# Patient Record
Sex: Female | Born: 1975 | Hispanic: Yes | Marital: Single | Smoking: Never smoker
Health system: Southern US, Community
[De-identification: ages and names within clinical notes are randomized; demographics above are authoritative.]

---

## 2017-10-22 ENCOUNTER — Encounter: Payer: Self-pay | Admitting: Pediatric Intensive Care

## 2017-10-30 ENCOUNTER — Ambulatory Visit: Payer: Self-pay | Admitting: Internal Medicine

## 2017-11-01 ENCOUNTER — Emergency Department (HOSPITAL_COMMUNITY): Payer: Self-pay

## 2017-11-01 ENCOUNTER — Emergency Department (HOSPITAL_COMMUNITY)
Admission: EM | Admit: 2017-11-01 | Discharge: 2017-11-01 | Disposition: A | Discharge status: Home / Self Care | Payer: Self-pay | Attending: Emergency Medicine | Admitting: Emergency Medicine

## 2017-11-01 ENCOUNTER — Encounter (HOSPITAL_COMMUNITY): Payer: Self-pay

## 2017-11-01 ENCOUNTER — Other Ambulatory Visit: Payer: Self-pay

## 2017-11-01 DIAGNOSIS — H9202 Otalgia, left ear: Secondary | ICD-10-CM | POA: Insufficient documentation

## 2017-11-01 DIAGNOSIS — R42 Dizziness and giddiness: Secondary | ICD-10-CM | POA: Insufficient documentation

## 2017-11-01 MED ORDER — NEOMYCIN-POLYMYXIN-HC 3.5-10000-1 OT SUSP: 4.0000 [drp] | Freq: Three times a day (TID) | OTIC | 0 refills | Status: AC

## 2017-11-01 NOTE — ED Triage Notes (Signed)
Pt was tx last month with amoxicillin and meclizine for ear pain and the sensation that something was in ear.  She's also been taking cipro with no relief.  C/o loss of hearing, ringing sensation and occasional dizziness (but no dizziness presently).

## 2017-11-01 NOTE — ED Provider Notes (Signed)
[facility] Provider Note   CSN: [id number] Arrival date & time: 11/01/17  1727     History   Chief Complaint Chief Complaint  Patient presents with  . Otalgia  . Dizziness    HPI Marissa Stephens is a 42 y.o. female who presents to ED for evaluation of several month history of left-sided ear pain, ringing in her ears, occasional dizziness and headache.  Patient has been treated with amoxicillin at another emergency room visit.  She has also been taking some Cipro eardrops and Cipro p.o. given by her mother from [place].  States that none of these are helping with her pain.  The only relief she got was from apparent benzocaine eardrops that she got over-the-counter.  She is concerned that there is a "worm" in her ear or another "animal" that is causing her to have the symptoms.  No inciting event noted.  Denies any vision changes, head injuries or falls, fever, neck pain, vomiting, changes in hearing, chronic NSAID use.  HPI  History reviewed. No pertinent past medical history.  There are no active problems to display for this patient.   History reviewed. No pertinent surgical history.   OB History   None      Home Medications    Prior to Admission medications   Medication Sig Start Date End Date Taking? Authorizing Provider  neomycin-polymyxin-hydrocortisone (CORTISPORIN) 3.5-10000-1 OTIC suspension Place 4 drops into the left ear 3 (three) times daily. 11/01/17   Dietrich PatesKhatri, Tesneem Dufrane, PA-C    Family History No family history on file.  Social History Social History   Tobacco Use  . Smoking status: Never Smoker  . Smokeless tobacco: Never Used  Substance Use Topics  . Alcohol use: Not Currently  . Drug use: Never     Allergies   Ibuprofen   Review of Systems Review of Systems  Constitutional: Negative for appetite change, chills and fever.  HENT: Positive for ear pain. Negative for rhinorrhea, sneezing and sore  throat.   Eyes: Negative for photophobia and visual disturbance.  Respiratory: Negative for cough, chest tightness, shortness of breath and wheezing.   Cardiovascular: Negative for chest pain and palpitations.  Gastrointestinal: Negative for abdominal pain, blood in stool, constipation, diarrhea, nausea and vomiting.  Genitourinary: Negative for dysuria, hematuria and urgency.  Musculoskeletal: Negative for myalgias.  Skin: Negative for rash.  Neurological: Positive for headaches. Negative for dizziness, weakness and light-headedness.     Physical Exam Updated Vital Signs BP 102/60 (BP Location: Right Arm)   Pulse 76   Temp 99 F (37.2 C) (Oral)   Resp 14   Ht 5\' 4"  (1.626 m)   Wt 49.9 kg (110 lb)   LMP 10/29/2017   SpO2 100%   BMI 18.88 kg/m   Physical Exam  Constitutional: She is oriented to person, place, and time. She appears well-developed and well-nourished. No distress.  HENT:  Head: Normocephalic and atraumatic.  Right Ear: Tympanic membrane normal.  Left Ear: Tympanic membrane normal.  Nose: Nose normal.  Eyes: Conjunctivae and EOM are normal. Left eye exhibits no discharge. No scleral icterus.  Neck: Normal range of motion. Neck supple.  Cardiovascular: Normal rate, regular rhythm, normal heart sounds and intact distal pulses. Exam reveals no gallop and no friction rub.  No murmur heard. Pulmonary/Chest: Effort normal and breath sounds normal. No respiratory distress.  Abdominal: Soft. Bowel sounds are normal. She exhibits no distension. There is no tenderness. There is no guarding.  Musculoskeletal:  Normal range of motion. She exhibits no edema.  Neurological: She is alert and oriented to person, place, and time. No cranial nerve deficit or sensory deficit. She exhibits normal muscle tone. Coordination normal.  Pupils reactive. No facial asymmetry noted. Cranial nerves appear grossly intact. Sensation intact to light touch on face.  Skin: Skin is warm and dry. No  rash noted.  Psychiatric: She has a normal mood and affect.  Nursing note and vitals reviewed.    ED Treatments / Results  Labs (all labs ordered are listed, but only abnormal results are displayed) Labs Reviewed - No data to display  EKG None  Radiology Ct Head Wo Contrast  Result Date: 11/01/2017 CLINICAL DATA:  42 year old female with dizziness and ringing sensation. EXAM: CT HEAD WITHOUT CONTRAST TECHNIQUE: Contiguous axial images were obtained from the base of the skull through the vertex without intravenous contrast. COMPARISON:  None. FINDINGS: Brain: No evidence of acute infarction, hemorrhage, hydrocephalus, extra-axial collection or mass lesion/mass effect. Vascular: No hyperdense vessel or unexpected calcification. Skull: Normal. Negative for fracture or focal lesion. Sinuses/Orbits: No acute finding. Other: None. IMPRESSION: Normal noncontrast CT of the brain. Electronically Signed   By: Elgie Collard M.D.   On: 11/01/2017 22:55    Procedures Procedures (including critical care time)  Medications Ordered in ED Medications - No data to display   Initial Impression / Assessment and Plan / ED Course  I have reviewed the triage vital signs and the nursing notes.  Pertinent labs & imaging results that were available during my care of the patient were reviewed by me and considered in my medical decision making (see chart for details).     42 year old female presents for evaluation of several month history of left-sided ear pain, ringing in her ears, occasional dizziness and headache.  She denies current dizziness and headache.  Patient has been seen and evaluated twice in the emergency room at other hospitals and was given amoxicillin.  Should her mother sent her some Cipro eardrops and Cipro p.o. from [place]  She is concerned that there is a "warmer animal" in her ear causing her to have the symptoms.  Denies any vision changes, head injuries or falls, fever, neck  pain or chronic NSAID use.  On physical exam there are no deficits on neurological exam noted.  There are no abnormalities of the TMs noted.  Patient requested CT of the head which returned as negative.  Suspect that her symptoms are viral.  I had a long discussion with the patient and family that she will most likely benefit from a follow-up appointment with ENT.  In the meantime will give Cortisporin eardrops and advised her to take Tylenol as needed for headache.  She states that meclizine has not helped her in the past.  Advised to return to ED for any severe worsening symptoms.  Portions of this note were generated with Scientist, clinical (histocompatibility and immunogenetics). Dictation errors may occur despite best attempts at proofreading.   Final Clinical Impressions(s) / ED Diagnoses   Final diagnoses:  Vertigo    ED Discharge Orders        Ordered    neomycin-polymyxin-hydrocortisone (CORTISPORIN) 3.5-10000-1 OTIC suspension  3 times daily     11/01/17 2315       Dietrich Pates, PA-C 11/01/17 2318    Loren Racer, MD 11/05/17 1515

## 2017-11-01 NOTE — ED Notes (Signed)
Patient transported to CT 

## 2017-11-13 NOTE — Congregational Nurse Program (Signed)
Congregational Nurse Program Note  Date of Encounter: 10/22/2017  Past Medical History: No past medical history on file.  Encounter Details: CNP Questionnaire - 10/22/17 1530      Questionnaire   Patient Status  Immigrant    Race  Hispanic or Latino    Location Patient Served At  [employer]    Insurance  Not Applicable    Uninsured  Uninsured (NEW 1x/quarter)    Food  No food insecurities    Housing/Utilities  Yes, have permanent housing    Transportation  No transportation needs    Interpersonal Safety  Yes, feel physically and emotionally safe where you currently live    Medication  No medication insecurities    Medical Provider  No    Referrals  Primary Care Provider/Clinic    ED Visit Averted  Not Applicable    Life-Saving Intervention Made  Not Applicable     Via interpreter Anselmo PicklerMarlen- client states that she has had an intermittent ear ache in left ear foe a little over a month.She states she thinks she has some animal inside her ear. She has [facility] at [facility], the family clinic on [address] and her mother has sent her cipro pills and antibiotic drops from [place]. CN explianed that she could not examine her ear. Client states that she would like an x-ray. CN [facility].

## 2018-11-05 IMAGING — CT CT HEAD W/O CM
4 series · 17 of 47 positions shown, 19 images · non-contrast
Comparison: None.

CLINICAL DATA: 41-year-old female with dizziness and ringing
sensation.

EXAM:
CT HEAD WITHOUT CONTRAST
TECHNIQUE: Contiguous axial images were obtained from the base of the skull
through the vertex without intravenous contrast.

[Series 3: head wo · axial · 0.39mm/px · z∈[-92,+28]mm · 7 of 32 slices shown, 9 images]
[im 4/32  brain]
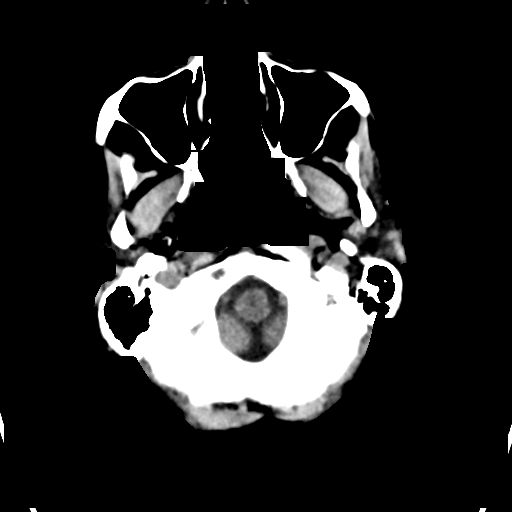
[im 4/32  bone]
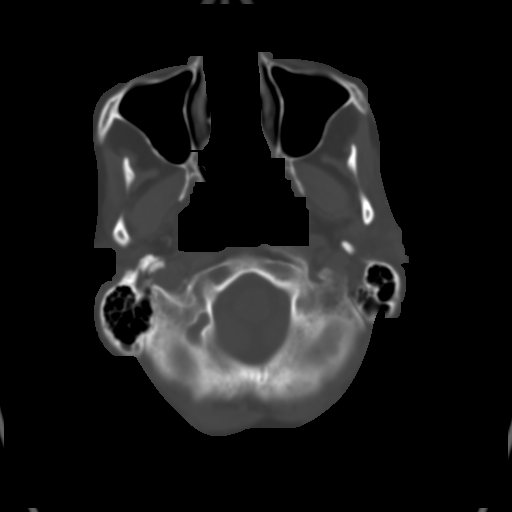
[im 8/32  brain]
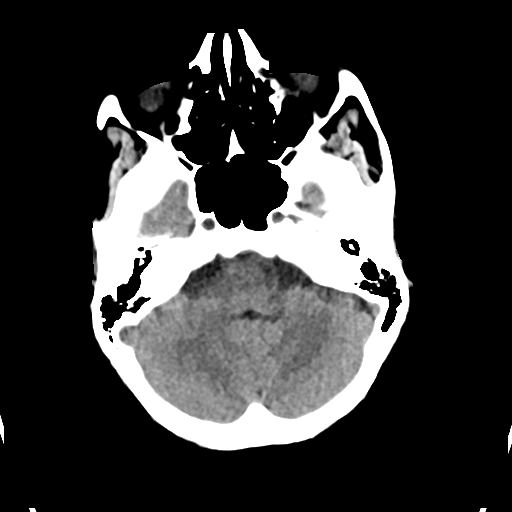
[im 12/32  brain]
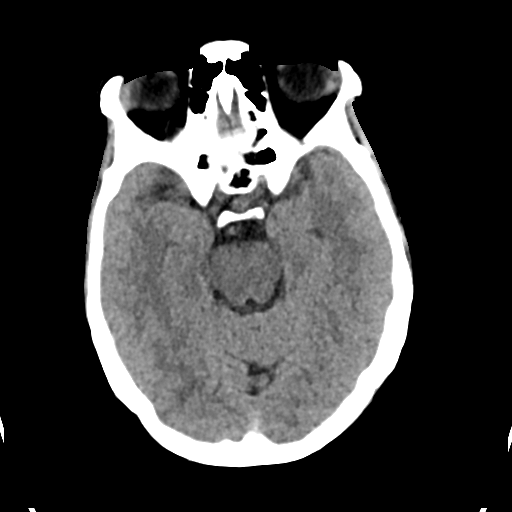
[im 16/32  brain]
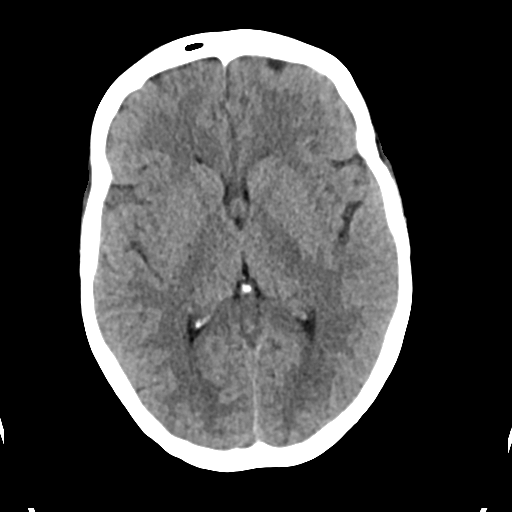
[im 20/32  brain]
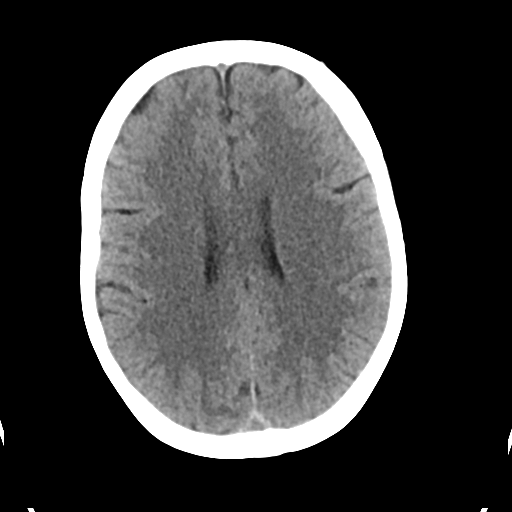
[im 20/32  bone]
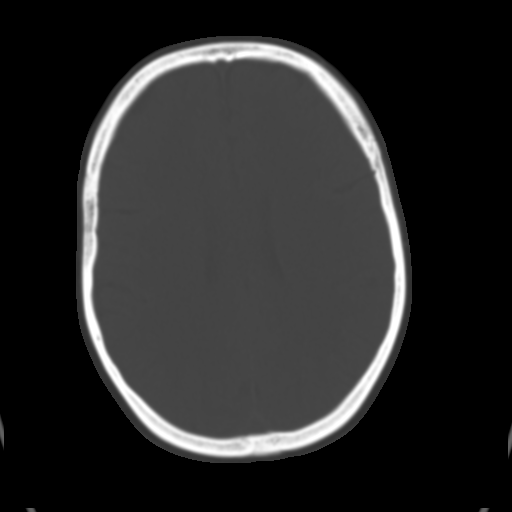
[im 24/32  brain]
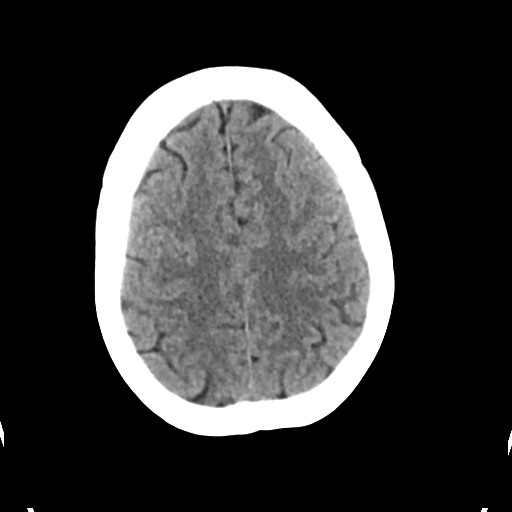
[im 28/32  brain]
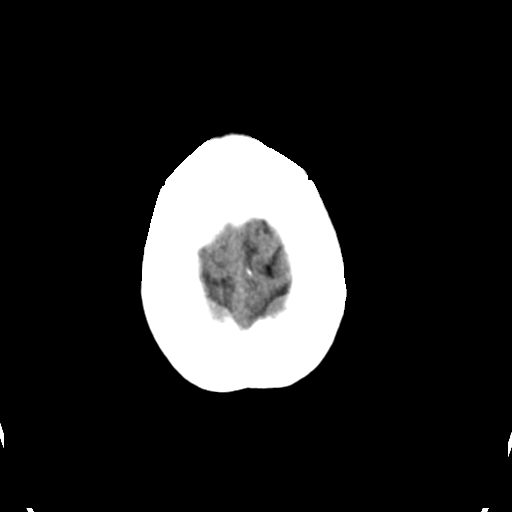

[Series 4: head bone · axial · 0.39mm/px · z∈[-92,-36]mm · 4 of 80 slices shown]
[im 8/80  bone]
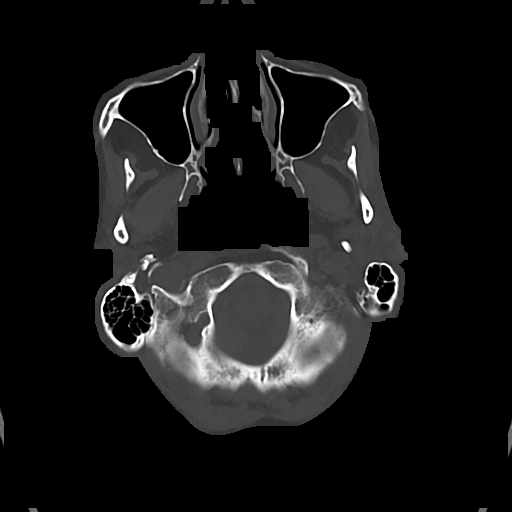
[im 16/80  bone]
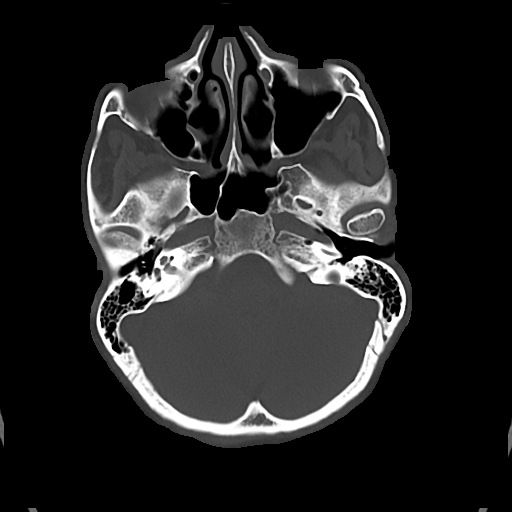
[im 24/80  bone]
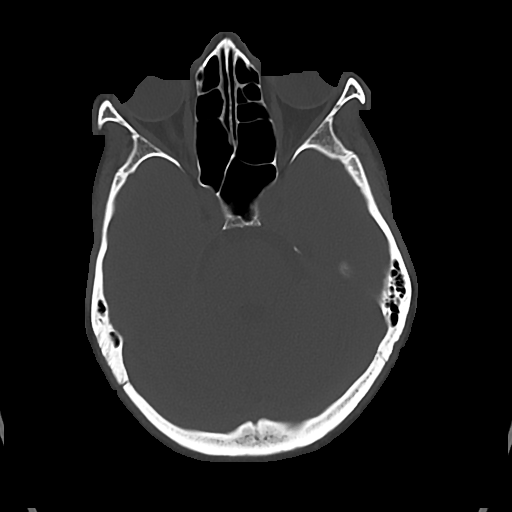
[im 36/80  bone]
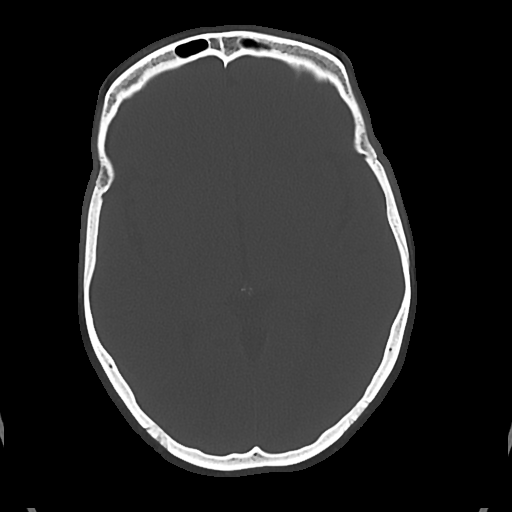

[Series 5: cor soft · coronal · 0.31mm/px · 3 of 67 slices shown]
[im 23/67  brain]
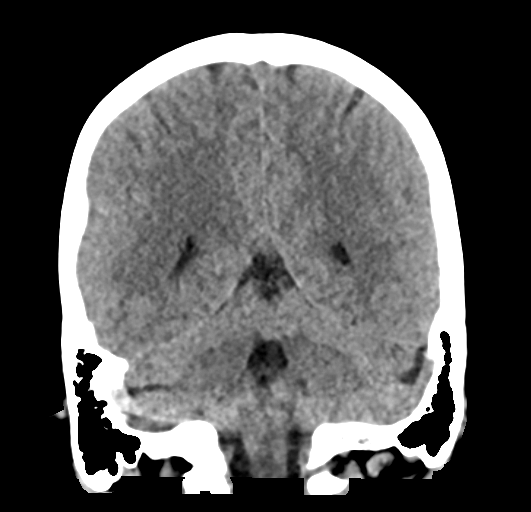
[im 30/67  brain]
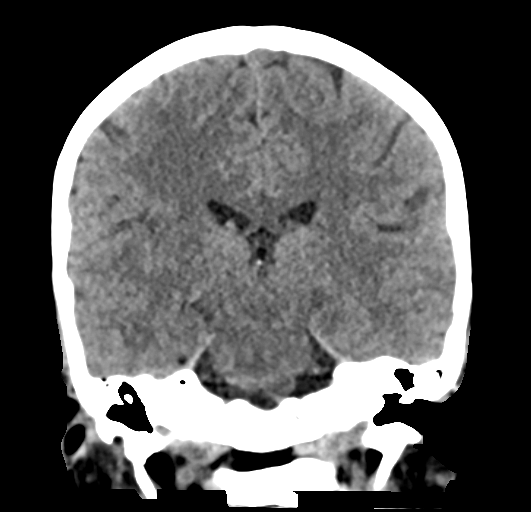
[im 37/67  brain]
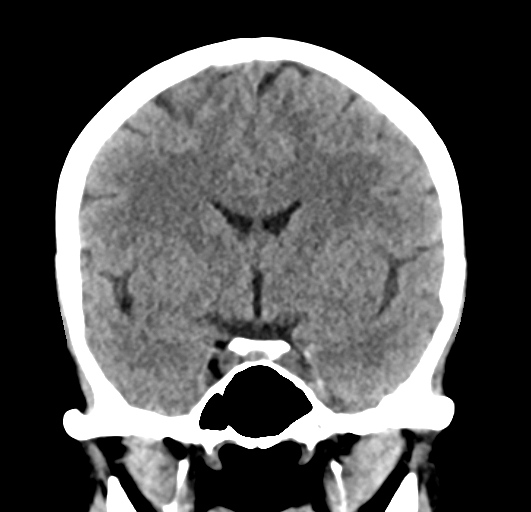

[Series 6: sag soft · sagittal · 0.31mm/px · 3 of 50 slices shown]
[im 17/50  brain]
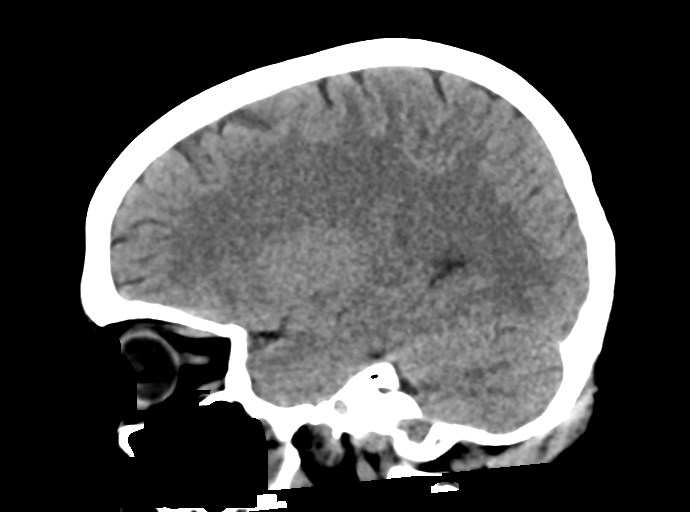
[im 25/50  brain]
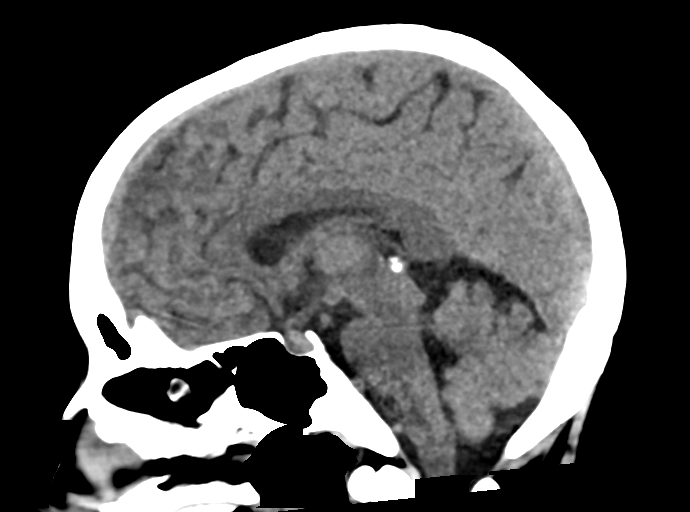
[im 33/50  brain]
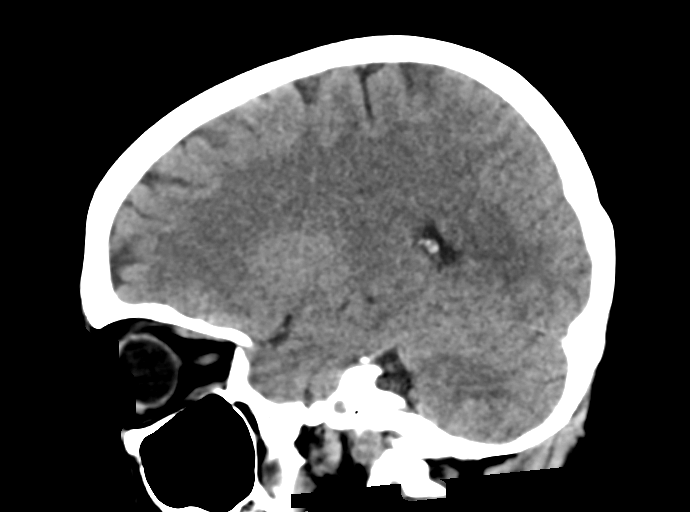

[17 of 47 positions shown; findings below may reference images not displayed]

FINDINGS: Brain: No evidence of acute infarction, hemorrhage, hydrocephalus,
extra-axial collection or mass lesion/mass effect.

Vascular: No hyperdense vessel or unexpected calcification.

Skull: Normal. Negative for fracture or focal lesion.

Sinuses/Orbits: No acute finding.

Other: None.
IMPRESSION: Normal noncontrast CT of the brain.
